# Patient Record
Sex: Male | Born: 1995 | Race: Black or African American | Hispanic: No | Marital: Single | Smoking: Never smoker
Health system: Southern US, Community
[De-identification: ages and names within clinical notes are randomized; demographics above are authoritative.]

## PROBLEM LIST (undated history)

## (undated) DIAGNOSIS — K649 Unspecified hemorrhoids: Secondary | ICD-10-CM

---

## 2016-11-04 ENCOUNTER — Emergency Department (HOSPITAL_COMMUNITY)
Admission: EM | Admit: 2016-11-04 | Discharge: 2016-11-04 | Disposition: A | Discharge status: Home / Self Care | Payer: Medicaid Other | Attending: Emergency Medicine | Admitting: Emergency Medicine

## 2016-11-04 ENCOUNTER — Encounter (HOSPITAL_COMMUNITY): Payer: Self-pay | Admitting: Family Medicine

## 2016-11-04 DIAGNOSIS — K644 Residual hemorrhoidal skin tags: Secondary | ICD-10-CM | POA: Diagnosis not present

## 2016-11-04 DIAGNOSIS — K649 Unspecified hemorrhoids: Secondary | ICD-10-CM | POA: Diagnosis present

## 2016-11-04 HISTORY — DX: Unspecified hemorrhoids: K64.9

## 2016-11-04 MED ORDER — HYDROCODONE-ACETAMINOPHEN 5-325 MG PO TABS
1.0000 | ORAL_TABLET | Freq: Once | ORAL | Status: AC
  Administered 2016-11-04: 1 via ORAL
  Filled 2016-11-04: qty 1

## 2016-11-04 MED ORDER — HYDROCORTISONE 2.5 % RE CREA: TOPICAL_CREAM | RECTAL | 0 refills | Status: DC

## 2016-11-04 MED ORDER — LIDOCAINE-EPINEPHRINE (PF) 2 %-1:200000 IJ SOLN
10.0000 mL | Freq: Once | INTRAMUSCULAR | Status: DC
  Filled 2016-11-04: qty 20

## 2016-11-04 MED ORDER — DOCUSATE SODIUM 100 MG PO CAPS: 100.0000 mg | ORAL_CAPSULE | Freq: Two times a day (BID) | ORAL | 0 refills | Status: DC

## 2016-11-04 NOTE — ED Triage Notes (Addendum)
Patient reports he has external hemorrhoids that started 3 days ago. Tried Preparation H suppository and Ibuprofen for pain.

## 2016-11-04 NOTE — Discharge Instructions (Signed)
Please follow up with general surgery Try Sitz baths (warm baths that you sit in) Take stool softener and use rectal cream

## 2016-11-04 NOTE — ED Notes (Signed)
Pt has a ride home.  

## 2016-11-04 NOTE — ED Provider Notes (Signed)
[facility]-EMERGENCY DEPT Provider Note   CSN: [id number] Arrival date & time: 11/04/16  1607 By signing my name below, I, Bridgette HabermannMaria Tan, attest that this documentation has been prepared under the direction and in the presence of Terance HartKelly Venicia Vandall, PA-C. Electronically Signed: Bridgette HabermannMaria Tan, ED Scribe. 11/04/16. 5:23 PM.  History   Chief Complaint Chief Complaint  Patient presents with  . Hemorrhoids   HPI Comments: Tony Hooper is a 20 y.o. male with h/o hemorrhoids who presents to the Emergency Department complaining of 8/10 rectal pain onset 3 days ago. Pain is exacerbated with bowel movements and sitting. It gets bigger and smaller throughout the day. Pt has tried Preparation H suppository and Ibuprofen with no relief. Pt states he has h/o hemorrhoids and notes his symptoms at this time feel similar. He has had them lanced ~ [place] and also had them surgically removed. Reports associated small amount of hematochezia. Pt denies fever, abdominal pain, N/V.  The history is provided by the patient. No language interpreter was used.    Past Medical History:  Diagnosis Date  . Hemorrhoids     There are no active problems to display for this patient.   History reviewed. No pertinent surgical history.   Home Medications    Prior to Admission medications   Not on File    Family History History reviewed. No pertinent family history.  Social History Social History  Substance Use Topics  . Smoking status: Never Smoker  . Smokeless tobacco: Never Used  . Alcohol use No     Allergies   Patient has no allergy information on record.   Review of Systems Review of Systems  Constitutional: Negative for fever.  Gastrointestinal: Positive for rectal pain.     Physical Exam Updated Vital Signs BP 140/74 (BP Location: Right Arm)   Pulse 70   Temp 98.2 F (36.8 C) (Oral)   Resp 20   Ht 6\' 2"  (1.88 m)   Wt 175 lb (79.4 kg)   SpO2 100%   BMI 22.47 kg/m   Physical Exam    Constitutional: He appears well-developed and well-nourished.  HENT:  Head: Normocephalic.  Eyes: Conjunctivae are normal.  Cardiovascular: Normal rate.   Pulmonary/Chest: Effort normal. No respiratory distress.  Abdominal: He exhibits no distension.  Genitourinary:  Genitourinary Comments: Rectal: Small, thrombosed external hemorrhoid which is tender to palpation. No gross blood, fissures, redness, area of fluctuance, lesions, or tenderness. Chaperone present during exam.  Musculoskeletal: Normal range of motion.  Neurological: He is alert.  Skin: Skin is warm and dry.  Psychiatric: He has a normal mood and affect. His behavior is normal.  Nursing note and vitals reviewed.    ED Treatments / Results  DIAGNOSTIC STUDIES: Oxygen Saturation is 100% on RA, normal by my interpretation.    COORDINATION OF CARE: 5:23 PM Discussed treatment plan with pt at bedside and pt agreed to plan.  Labs (all labs ordered are listed, but only abnormal results are displayed) Labs Reviewed - No data to display  EKG  EKG Interpretation None       Radiology No results found.  Procedures Procedures (including critical care time)  Medications Ordered in ED Medications  lidocaine-EPINEPHrine (XYLOCAINE W/EPI) 2 %-1:200000 (PF) injection 10 mL (not administered)  HYDROcodone-acetaminophen (NORCO/VICODIN) 5-325 MG per tablet 1 tablet (1 tablet Oral Given 11/04/16 1754)     Initial Impression / Assessment and Plan / ED Course  I have reviewed the triage vital signs and the nursing  notes.  Pertinent labs & imaging results that were available during my care of the patient were reviewed by me and considered in my medical decision making (see chart for details).  Clinical Course    20 year old male with thrombosed hemorrhoid. He was seen an evaluated with Dr. Criss AlvineGoldston. Due to small size, will defer lancing. Rx given for Anusol and Colace. Recommend sitz baths and follow up with general  surgery. Patient is NAD, non-toxic, with stable VS. Patient is informed of clinical course, understands medical decision making process, and agrees with plan. Opportunity for questions provided and all questions answered. Return precautions given.   Final Clinical Impressions(s) / ED Diagnoses   Final diagnoses:  External hemorrhoids    New Prescriptions New Prescriptions   No medications on file   I personally performed the services described in this documentation, which was scribed in my presence. The recorded information has been reviewed and is accurate.     Bethel BornKelly Marie Kamira Mellette, PA-C 11/04/16 2109    Pricilla LovelessScott Goldston, MD 11/11/16 0100

## 2016-12-29 ENCOUNTER — Encounter (HOSPITAL_COMMUNITY): Payer: Self-pay

## 2016-12-29 ENCOUNTER — Emergency Department (HOSPITAL_COMMUNITY)
Admission: EM | Admit: 2016-12-29 | Discharge: 2016-12-30 | Disposition: A | Discharge status: Home / Self Care | Payer: Medicaid Other | Attending: Emergency Medicine | Admitting: Emergency Medicine

## 2016-12-29 ENCOUNTER — Emergency Department (HOSPITAL_COMMUNITY): Payer: Medicaid Other

## 2016-12-29 DIAGNOSIS — N50811 Right testicular pain: Secondary | ICD-10-CM | POA: Insufficient documentation

## 2016-12-29 DIAGNOSIS — Z79899 Other long term (current) drug therapy: Secondary | ICD-10-CM | POA: Diagnosis not present

## 2016-12-29 MED ORDER — IBUPROFEN 200 MG PO TABS
400.0000 mg | ORAL_TABLET | Freq: Once | ORAL | Status: AC
  Administered 2016-12-30: 400 mg via ORAL
  Filled 2016-12-29: qty 2

## 2016-12-29 NOTE — ED Triage Notes (Signed)
Pt states that he noticed a lump on his right testicle about 3 months ago. States that it has started bothering him more and more prompting him to come in tonight. Reports 8/10 pain. Denies N/V/F. A&Ox4. Ambulatory.

## 2016-12-29 NOTE — ED Provider Notes (Signed)
MSE was initiated and I personally evaluated the patient and placed orders (if any) at  11:01 PM on December 29, 2016.  The patient appears stable so that the remainder of the MSE may be completed by another provider.  Blood pressure 164/79, pulse 91, temperature 98.3 F (36.8 C), temperature source Oral, resp. rate 16, SpO2 100 %.  Tony Hooper is a 21 y.o. male complaining of right testicular pain which she's had for several months. He states that the pain has increased recently to 7 out of 10. Denies dysuria, urethral discharge, urinary frequency. He states that he feels a lump in the right testicle.  Physical exam with no significant testicular swelling, tenderness, no overlying skin changes.  Patient put in for urinalysis, ultrasound ibuprofen for comfort.   Wynetta Emeryicole Toriana Sponsel, PA-C 12/29/16 2302    Dione Boozeavid Glick, MD 12/30/16 [phone]

## 2016-12-30 LAB — URINALYSIS, ROUTINE W REFLEX MICROSCOPIC
BILIRUBIN URINE: NEGATIVE
Glucose, UA: NEGATIVE mg/dL
Hgb urine dipstick: NEGATIVE
KETONES UR: NEGATIVE mg/dL
Leukocytes, UA: NEGATIVE
NITRITE: NEGATIVE
Protein, ur: NEGATIVE mg/dL
Specific Gravity, Urine: 1.017 (ref 1.005–1.030)
pH: 7 (ref 5.0–8.0)

## 2016-12-30 NOTE — ED Provider Notes (Signed)
[facility]-EMERGENCY DEPT Provider Note   CSN: [id number] Arrival date & time: 12/29/16  2055     History   Chief Complaint Chief Complaint  Patient presents with  . Testicle Pain    HPI   Blood pressure 158/86, pulse 71, temperature 98.3 F (36.8 C), temperature source Oral, resp. rate 15, SpO2 95 %.  Tony Hooper is a 21 y.o. male complaining of right testicular pain and swelling which she noticed 3 months ago, worsened today to about 7 out of 10. No pain medication taken prior to arrival. He states that the right scrotum is swollen. It hasn't significantly worsened recently. He denies dysuria, hematuria, urethral discharge or urinary frequency. No other rash or lesion.  Past Medical History:  Diagnosis Date  . Hemorrhoids     There are no active problems to display for this patient.   History reviewed. No pertinent surgical history.     Home Medications    Prior to Admission medications   Medication Sig Start Date End Date Taking? Authorizing Provider  docusate sodium (COLACE) 100 MG capsule Take 1 capsule (100 mg total) by mouth every 12 (twelve) hours. 11/04/16   Bethel Born, PA-C  hydrocortisone (ANUSOL-HC) 2.5 % rectal cream Apply rectally 2 times daily 11/04/16   Bethel Born, PA-C    Family History History reviewed. No pertinent family history.  Social History Social History  Substance Use Topics  . Smoking status: Never Smoker  . Smokeless tobacco: Never Used  . Alcohol use No     Allergies   Patient has no known allergies.   Review of Systems Review of Systems  10 systems reviewed and found to be negative, except as noted in the HPI.   Physical Exam Updated Vital Signs BP 164/79 (BP Location: Right Arm)   Pulse 91   Temp 98.3 F (36.8 C) (Oral)   Resp 16   SpO2 100%   Physical Exam  Constitutional: He is oriented to person, place, and time. He appears well-developed and well-nourished. No distress.  HENT:  Head:  Normocephalic and atraumatic.  Mouth/Throat: Oropharynx is clear and moist.  Eyes: Conjunctivae and EOM are normal. Pupils are equal, round, and reactive to light.  Neck: Normal range of motion.  Cardiovascular: Normal rate, regular rhythm and intact distal pulses.   Pulmonary/Chest: Effort normal and breath sounds normal.  Abdominal: Soft. There is no tenderness.  Genitourinary:  Genitourinary Comments: GU exam a chaperoned by nurse: No rashes or lesions, no significant testicular swelling, no overlying skin changes, no tenderness to palpation.  Musculoskeletal: Normal range of motion.  Neurological: He is alert and oriented to person, place, and time.  Skin: He is not diaphoretic.  Psychiatric: He has a normal mood and affect.  Nursing note and vitals reviewed.    ED Treatments / Results  Labs (all labs ordered are listed, but only abnormal results are displayed) Labs Reviewed  URINALYSIS, ROUTINE W REFLEX MICROSCOPIC  GC/CHLAMYDIA PROBE AMP ([place]) NOT AT [facility]    EKG  EKG Interpretation None       Radiology No results found.  Procedures Procedures (including critical care time)  Medications Ordered in ED Medications  ibuprofen (ADVIL,MOTRIN) tablet 400 mg (not administered)     Initial Impression / Assessment and Plan / ED Course  I have reviewed the triage vital signs and the nursing notes.  Pertinent labs & imaging results that were available during my care of the patient were reviewed by me and considered in  my medical decision making (see chart for details).     Vitals:   12/29/16 2132 12/30/16 0024  BP: 164/79 158/86  Pulse: 91 71  Resp: 16 15  Temp: 98.3 F (36.8 C)   TempSrc: Oral   SpO2: 100% 95%    Medications  ibuprofen (ADVIL,MOTRIN) tablet 400 mg (400 mg Oral Given 12/30/16 0022)    Tony Hooper is 21 y.o. male presenting with Several months of right testicular pain and swelling. On my exam I don't find any abnormalities,  ultrasound of scrotum also negative. Ultrasound tech dates that were patient is calling a masses actually his epididymis. Urinalysis without signs of infection. Reassured patient and his mother.  Evaluation does not show pathology that would require ongoing emergent intervention or inpatient treatment. Pt is hemodynamically stable and mentating appropriately. Discussed findings and plan with patient/guardian, who agrees with care plan. All questions answered. Return precautions discussed and outpatient follow up given.      Final Clinical Impressions(s) / ED Diagnoses   Final diagnoses:  None    New Prescriptions New Prescriptions   No medications on file     Wynetta Emeryicole Olman Yono, PA-C 12/30/16 0048    Dione Boozeavid Glick, MD 12/30/16 [phone]

## 2016-12-30 NOTE — ED Notes (Signed)
Patient d/c'd self care.  F/U reviewed.  Patient verbalized understanding. 

## 2016-12-30 NOTE — Discharge Instructions (Signed)
For pain control please take Ibuprofen (also known as Motrin or Advil) 400mg (this is normally 2 over the counter pills) every 6 hours. Take with food to minimize stomach irritation. ° °Do not hesitate to return to the emergency room for any new, worsening or concerning symptoms. ° °Please obtain primary care using resource guide below. Let them know that you were seen in the emergency room and that they will need to obtain records for further outpatient management. ° ° °

## 2017-01-01 LAB — GC/CHLAMYDIA PROBE AMP (~~LOC~~) NOT AT ARMC
Chlamydia: NEGATIVE
Neisseria Gonorrhea: NEGATIVE

## 2018-03-02 IMAGING — US US SCROTUM
1 series · 14 of 25 positions shown · non-contrast
Comparison: None.

CLINICAL DATA: 20 y/o  M; lump of the right testicle for 3 months.

EXAM:
ULTRASOUND OF SCROTUM
TECHNIQUE: Complete ultrasound examination of the testicles, epididymis, and
other scrotal structures was performed.

[Series 1: us scrotum · 0.07mm/px · 14 of 43 slices shown]
[im 1/43]
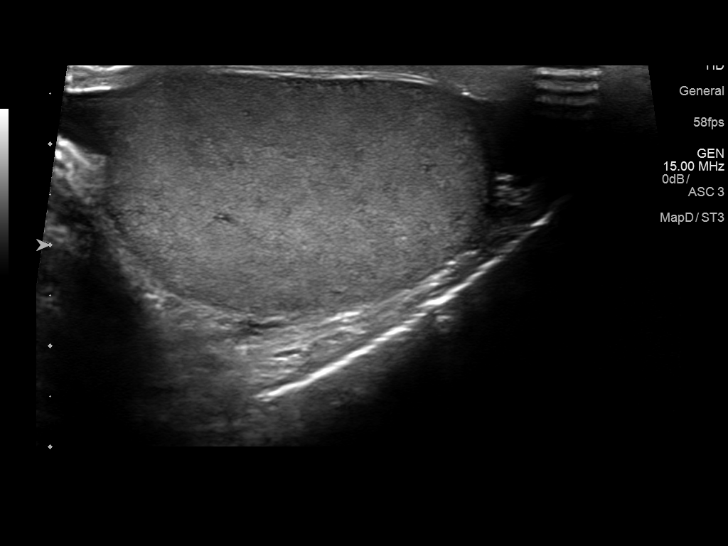
[im 4/43]
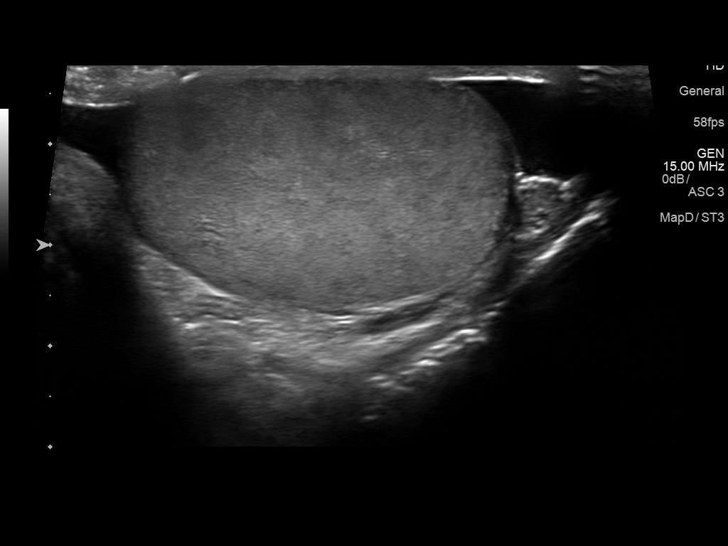
[im 8/43]
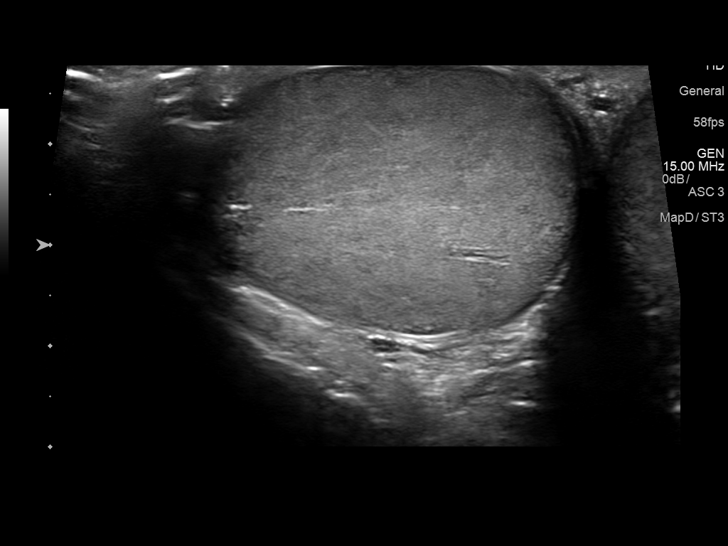
[im 11/43]
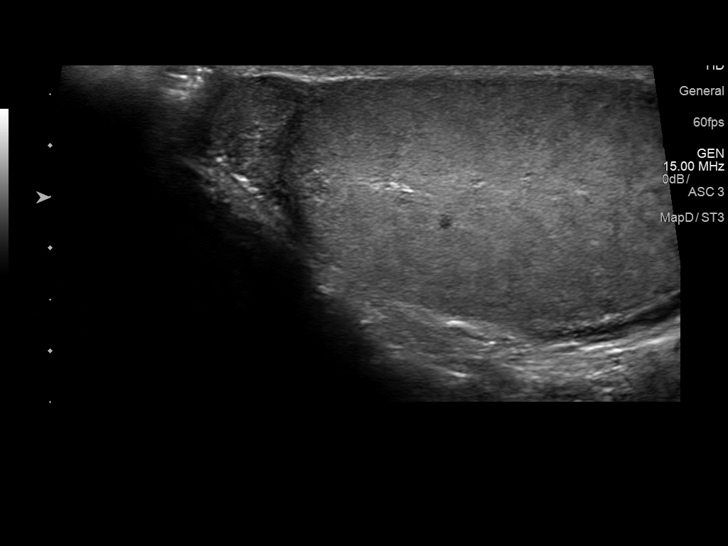
[im 15/43]
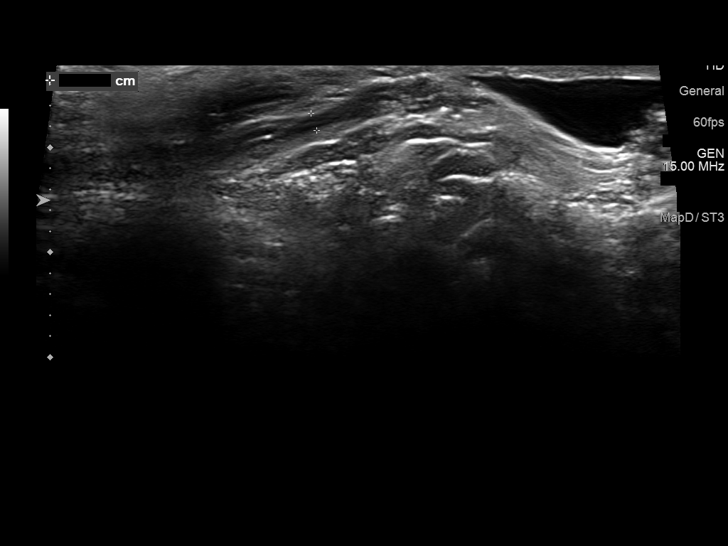
[im 16/43]
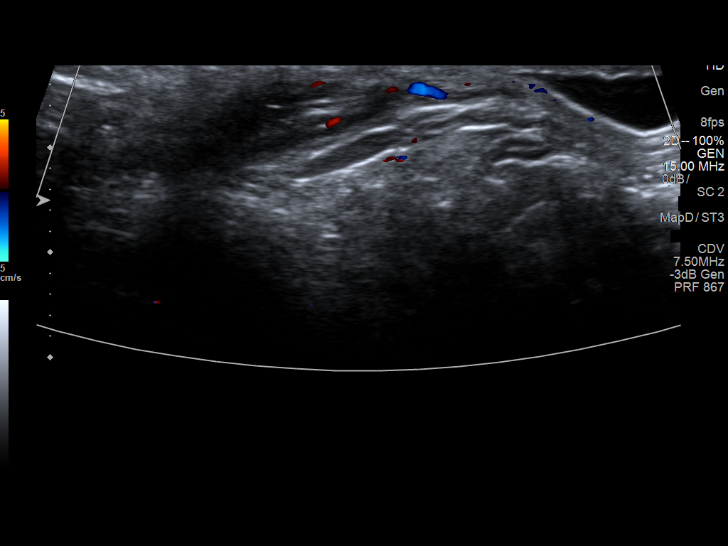
[im 20/43]
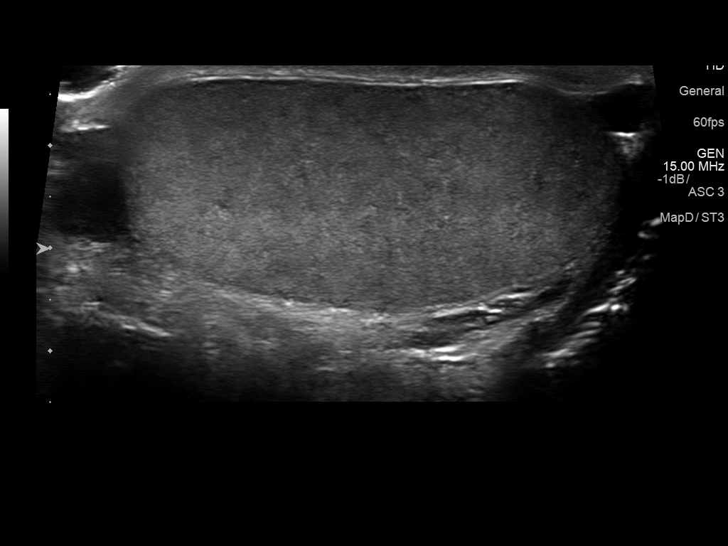
[im 23/43]
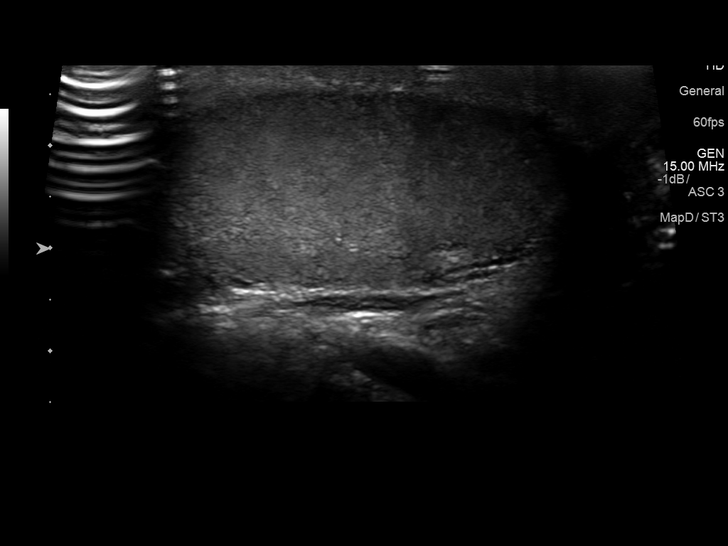
[im 27/43]
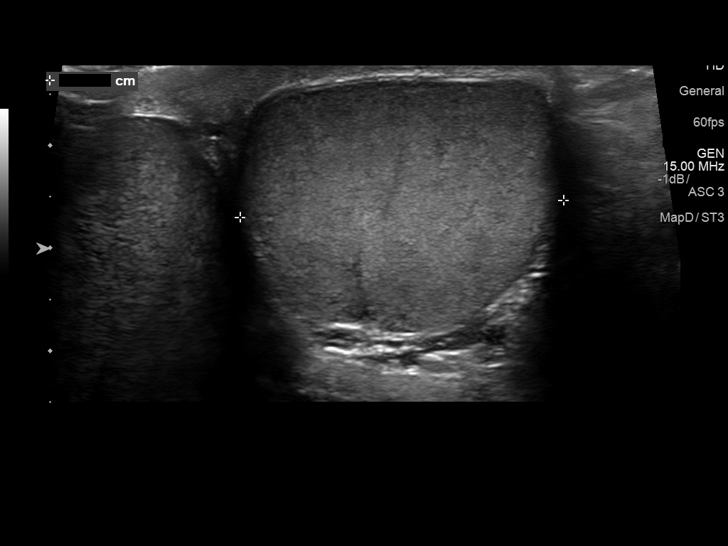
[im 29/43]
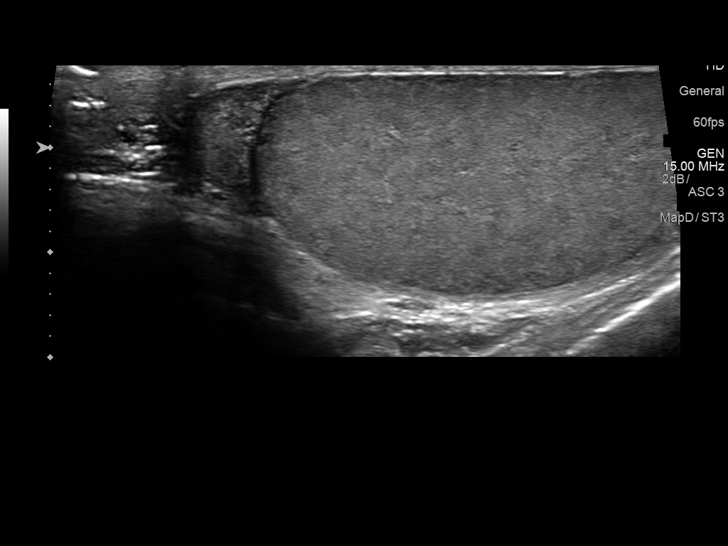
[im 32/43]
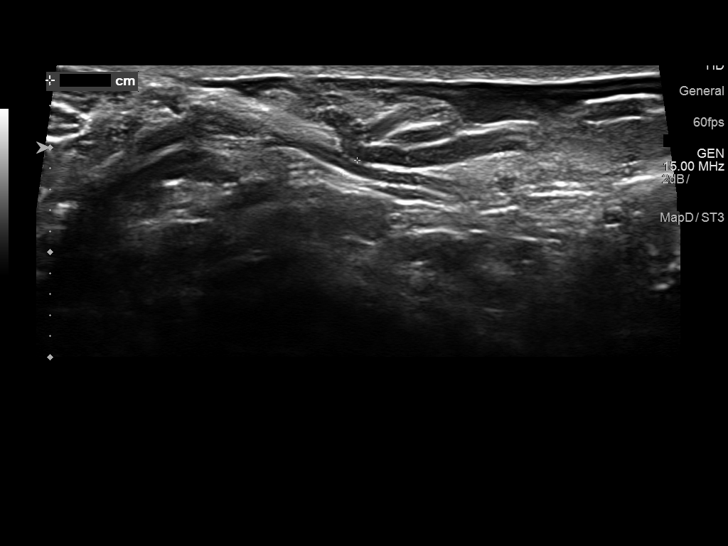
[im 36/43]
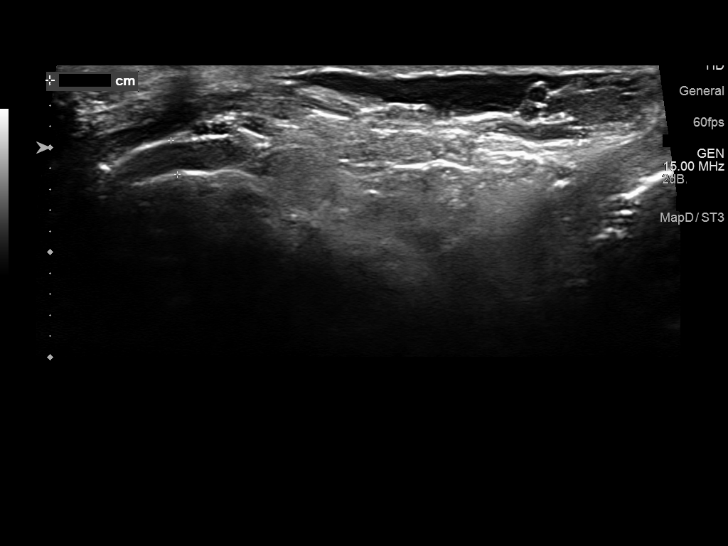
[im 39/43]
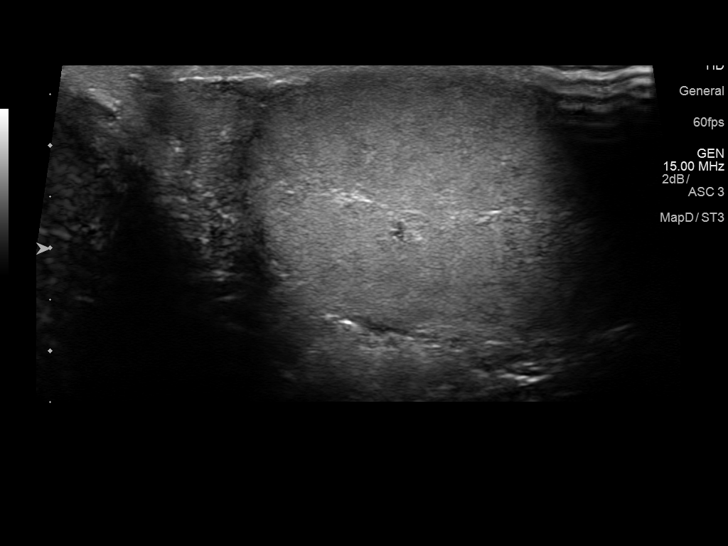
[im 43/43]
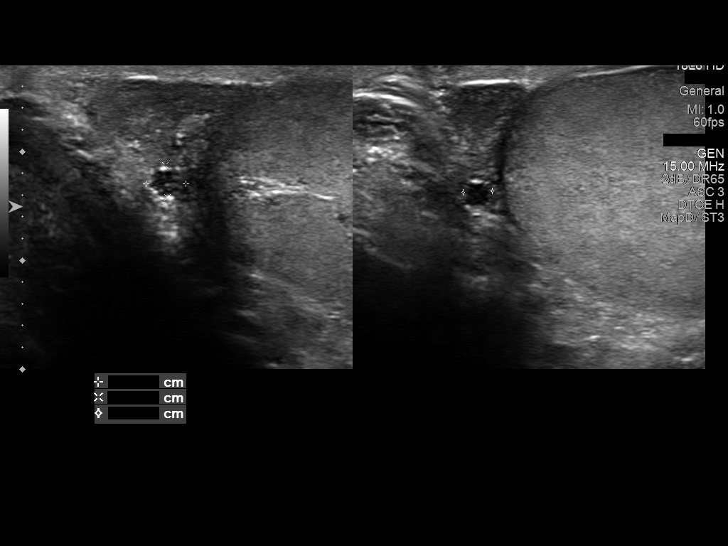

[14 of 25 positions shown; findings below may reference images not displayed]

FINDINGS: Right testicle

Measurements: 4.0 x 2.3 x 3.6 cm. No mass or microlithiasis
visualized.

Left testicle

Measurements: 4.9 x 2.2 x 3.2 cm. No mass or microlithiasis
visualized.

Right epididymis: Normal in size and appearance. 4 mm simple
appearing cyst.

Left epididymis:  Normal in size and appearance.

Hydrocele:  None visualized.

Varicocele:  None visualized.
IMPRESSION: Negative. No evidence for testicular mass or other significant
abnormality. Area of palpable interest corresponds to the
epididymis.

By: Ferienhaus Erxleben M.D.

## 2019-08-22 ENCOUNTER — Emergency Department (HOSPITAL_COMMUNITY)
Admission: EM | Admit: 2019-08-22 | Discharge: 2019-08-22 | Disposition: A | Discharge status: Home / Self Care | Payer: Medicaid - Out of State

## 2019-08-22 ENCOUNTER — Other Ambulatory Visit: Payer: Self-pay

## 2019-08-22 ENCOUNTER — Emergency Department (HOSPITAL_COMMUNITY)
Admission: EM | Admit: 2019-08-22 | Discharge: 2019-08-22 | Disposition: A | Discharge status: Home / Self Care | Payer: Medicaid - Out of State | Attending: Emergency Medicine | Admitting: Emergency Medicine

## 2019-08-22 DIAGNOSIS — R51 Headache: Secondary | ICD-10-CM | POA: Insufficient documentation

## 2019-08-22 DIAGNOSIS — R112 Nausea with vomiting, unspecified: Secondary | ICD-10-CM

## 2019-08-22 DIAGNOSIS — Z20828 Contact with and (suspected) exposure to other viral communicable diseases: Secondary | ICD-10-CM | POA: Diagnosis not present

## 2019-08-22 DIAGNOSIS — Z20822 Contact with and (suspected) exposure to covid-19: Secondary | ICD-10-CM

## 2019-08-22 LAB — SARS CORONAVIRUS 2 (TAT 6-24 HRS): SARS Coronavirus 2: NEGATIVE

## 2019-08-22 MED ORDER — ONDANSETRON HCL 4 MG PO TABS: 4.0000 mg | ORAL_TABLET | Freq: Three times a day (TID) | ORAL | 0 refills | Status: AC | PRN

## 2019-08-22 NOTE — ED Notes (Signed)
An After Visit Summary was printed and given to the patient. Discharge instructions given and no further questions at this time.  

## 2019-08-22 NOTE — Discharge Instructions (Addendum)
You have been tested for COVID-19. IT will take a few days before the test will result.  You will be notify if you test positive.  Follow instruction below.

## 2019-08-22 NOTE — ED Triage Notes (Addendum)
Pt c/o headache, emesis x2 days. Pt states he was exposed to [place] with COVID+ uncle.

## 2019-08-22 NOTE — ED Notes (Signed)
Called pt x3 for triage. No response.

## 2019-08-22 NOTE — ED Provider Notes (Signed)
[name] Provider Note   CSN: [id number] Arrival date & time: 08/22/19  1241     History   Chief Complaint Chief Complaint  Patient presents with  . Headache  . Emesis    HPI Tony Hooper is a 23 y.o. male.     The history is provided by the patient. No language interpreter was used.  Headache Associated symptoms: vomiting   Emesis Associated symptoms: headaches      23 year old male presenting with complaints of headache.  Patient report he was exposed to his uncle who recently test positive for COVID-19.  Patient states 3 days ago he was hanging out with his uncle at that time concerned was complaining of a headache in which he had improved after he took some over-the-counter medication.  He did went to have his covid test and he was reported to test positive for COVID-19.  Patient states for the past 2 days he also endorsed a mild throbbing headache, felt nauseous, vomited once, and has had several bouts of loose stools.  Endorsed mild abdominal cramping.  He is concerned for potential COVID-19 given recent exposure.  He does not complain of any chest pain or shortness of breath denies runny nose sneezing or coughing loss of taste or smell.  No recent travel.  No specific treatment tried.  He denies any light or sound sensitivity or neck stiffness.  Past Medical History:  Diagnosis Date  . Hemorrhoids     There are no active problems to display for this patient.   No past surgical history on file.      Home Medications    Prior to Admission medications   Medication Sig Start Date End Date Taking? Authorizing Provider  docusate sodium (COLACE) 100 MG capsule Take 1 capsule (100 mg total) by mouth every 12 (twelve) hours. 11/04/16   Recardo Evangelist, PA-C  hydrocortisone (ANUSOL-HC) 2.5 % rectal cream Apply rectally 2 times daily 11/04/16   Recardo Evangelist, PA-C    Family History No family history on file.  Social  History Social History   Tobacco Use  . Smoking status: Never Smoker  . Smokeless tobacco: Never Used  Substance Use Topics  . Alcohol use: No  . Drug use: No     Allergies   Patient has no known allergies.   Review of Systems Review of Systems  Gastrointestinal: Positive for vomiting.  Neurological: Positive for headaches.  All other systems reviewed and are negative.    Physical Exam Updated Vital Signs BP 132/77   Pulse 88   Temp 97.9 F (36.6 C) (Oral)   Resp 18   SpO2 99%   Physical Exam Vitals signs and nursing note reviewed.  Constitutional:      General: He is not in acute distress.    Appearance: He is well-developed.     Comments: Patient is well-appearing in no acute discomfort.  HENT:     Head: Atraumatic.  Eyes:     Conjunctiva/sclera: Conjunctivae normal.  Neck:     Musculoskeletal: Normal range of motion and neck supple. No neck rigidity.  Cardiovascular:     Rate and Rhythm: Normal rate and regular rhythm.  Pulmonary:     Effort: Pulmonary effort is normal.     Breath sounds: Normal breath sounds. No wheezing.  Abdominal:     General: Bowel sounds are normal.     Palpations: Abdomen is soft.     Tenderness: There is no abdominal tenderness.  Lymphadenopathy:     Cervical: No cervical adenopathy.  Skin:    Findings: No rash.  Neurological:     Mental Status: He is alert.     GCS: GCS eye subscore is 4. GCS verbal subscore is 5. GCS motor subscore is 6.  Psychiatric:        Mood and Affect: Mood normal.      ED Treatments / Results  Labs (all labs ordered are listed, but only abnormal results are displayed) Labs Reviewed  SARS CORONAVIRUS 2 (TAT 6-24 HRS)    EKG None  Radiology No results found.  Procedures Procedures (including critical care time)  Medications Ordered in ED Medications - No data to display   Initial Impression / Assessment and Plan / ED Course  I have reviewed the triage vital signs and the nursing  notes.  Pertinent labs & imaging results that were available during my care of the patient were reviewed by me and considered in my medical decision making (see chart for details).        BP 132/77   Pulse 88   Temp 97.9 F (36.6 C) (Oral)   Resp 18   SpO2 99%    Final Clinical Impressions(s) / ED Diagnoses   Final diagnoses:  Suspected Covid-19 Virus Infection  Nausea vomiting and diarrhea    ED Discharge Orders         Ordered    ondansetron (ZOFRAN) 4 MG tablet  Every 8 hours PRN     08/22/19 1418         2:16 PM Patient report recently exposed to his uncle who test positive for COVID-19 a few days ago.  Patient reports concerning for potential exposure.  He does not have any right upper respiratory problem no chest pain or shortness of breath.  He does complain of some nausea vomiting diarrhea.  Abdomen is soft nontender on exam.  Will obtain COVID-19 test.  Instruction to self quarantine was given.  Patient will be notified if test positive.  [name] was evaluated in Emergency Department on 08/22/2019 for the symptoms described in the history of present illness. He was evaluated in the context of the global COVID-19 pandemic, which necessitated consideration that the patient might be at risk for infection with the SARS-CoV-2 virus that causes COVID-19. Institutional protocols and algorithms that pertain to the evaluation of patients at risk for COVID-19 are in a state of rapid change based on information released by regulatory bodies including the CDC and federal and state organizations. These policies and algorithms were followed during the patient's care in the ED.    Fayrene Helperran, Linnell Swords, PA-C 08/22/19 1420    Little, Ambrose Finlandachel Morgan, MD 08/23/19 1149

## 2019-08-25 ENCOUNTER — Telehealth: Payer: Self-pay | Admitting: General Practice

## 2019-08-25 NOTE — Telephone Encounter (Signed)
Pt aware covid lab test negative, not detected °

## 2022-09-11 ENCOUNTER — Emergency Department (HOSPITAL_COMMUNITY)
Admission: EM | Admit: 2022-09-11 | Discharge: 2022-09-11 | Disposition: A | Discharge status: Home / Self Care | Payer: Medicaid Other | Attending: Emergency Medicine | Admitting: Emergency Medicine

## 2022-09-11 ENCOUNTER — Encounter (HOSPITAL_COMMUNITY): Payer: Self-pay

## 2022-09-11 DIAGNOSIS — K649 Unspecified hemorrhoids: Secondary | ICD-10-CM | POA: Insufficient documentation

## 2022-09-11 MED ORDER — DOCUSATE SODIUM 100 MG PO CAPS: 100.0000 mg | ORAL_CAPSULE | Freq: Two times a day (BID) | ORAL | 0 refills | Status: AC

## 2022-09-11 MED ORDER — HYDROCORTISONE (PERIANAL) 2.5 % EX CREA: 1.0000 | TOPICAL_CREAM | Freq: Two times a day (BID) | CUTANEOUS | 0 refills | Status: AC

## 2022-09-11 NOTE — Discharge Instructions (Addendum)
Use the Anusol cream twice daily and take docusate every 12 hours to help with stool softening.  Do sitz bath 3 times daily, a lot of the times these will resolve on their own.  If it does not resolve you will need to follow-up with general surgery, information is above.  Return if you are unable to defecate, fevers, new concerning symptoms.

## 2022-09-11 NOTE — ED Triage Notes (Signed)
Pt arrived via POV, c/o hemorrhoids. States hx of same.

## 2022-09-11 NOTE — ED Provider Notes (Signed)
[facility] DEPT Provider Note   CSN: [id number] Arrival date & time: 09/11/22  1252     History  No chief complaint on file.   Tony Hooper is a 26 y.o. male.  HPI   Patient presents due to hemorrhoids.  Started about 3 days ago, history of the same.  He has had previous right ectomy, not currently followed by general surgery.  He denies any abdominal pain, nausea, vomiting, diarrhea.  He is able to have bowel movements although that it makes the pain worse, denies any fevers or chills.  Did not receive receptive sex.  Home Medications Prior to Admission medications   Medication Sig Start Date End Date Taking? Authorizing Provider  hydrocortisone (ANUSOL-HC) 2.5 % rectal cream Place 1 Application rectally 2 (two) times daily. 09/11/22  Yes Sherrill Raring, PA-C  docusate sodium (COLACE) 100 MG capsule Take 1 capsule (100 mg total) by mouth every 12 (twelve) hours. 09/11/22   Sherrill Raring, PA-C  ondansetron (ZOFRAN) 4 MG tablet Take 1 tablet (4 mg total) by mouth every 8 (eight) hours as needed for nausea or vomiting. 08/22/19   Domenic Moras, PA-C      Allergies    Patient has no known allergies.    Review of Systems   Review of Systems  Physical Exam Updated Vital Signs BP 135/80 (BP Location: Left Arm)   Pulse 92   Temp 98.1 F (36.7 C) (Oral)   Resp 16   SpO2 96%  Physical Exam Vitals and nursing note reviewed. Exam conducted with a chaperone present.  Constitutional:      Appearance: Normal appearance.  HENT:     Head: Normocephalic and atraumatic.  Eyes:     General: No scleral icterus.       Right eye: No discharge.        Left eye: No discharge.     Extraocular Movements: Extraocular movements intact.     Pupils: Pupils are equal, round, and reactive to light.  Cardiovascular:     Rate and Rhythm: Normal rate and regular rhythm.     Pulses: Normal pulses.     Heart sounds: Normal heart sounds. No murmur heard.    No friction rub.  No gallop.  Pulmonary:     Effort: Pulmonary effort is normal. No respiratory distress.     Breath sounds: Normal breath sounds.  Abdominal:     General: Abdomen is flat. Bowel sounds are normal. There is no distension.     Palpations: Abdomen is soft.     Tenderness: There is no abdominal tenderness.  Genitourinary:    Comments: Rectal exam performed with male chaperone in room.  Patient has external hemorrhoid as well as a very small thrombosed hemorrhoid roughly 7 PM.  No active bleeding, no induration or erythema.  No anal fissure appreciated. Skin:    General: Skin is warm and dry.     Coloration: Skin is not jaundiced.  Neurological:     Mental Status: He is alert. Mental status is at baseline.     Coordination: Coordination normal.     ED Results / Procedures / Treatments   Labs (all labs ordered are listed, but only abnormal results are displayed) Labs Reviewed - No data to display  EKG None  Radiology No results found.  Procedures Procedures    Medications Ordered in ED Medications - No data to display  ED Course/ Medical Decision Making/ A&P  Medical Decision Making Risk OTC drugs. Prescription drug management.   Patient presents due to hemorrhoid pain x72 hours.  Physical exam seems consistent with a small thrombosed hemorrhoid.  No anal fissure, obstructing mass, actively bleeding hemorrhoid, abscess.  Will have patient follow-up with general surgery as needed, advised sitz bath, Anusol and docusate.  Strict return precautions were discussed, patient discharged stable condition.        Final Clinical Impression(s) / ED Diagnoses Final diagnoses:  Hemorrhoids, unspecified hemorrhoid type    Rx / DC Orders ED Discharge Orders          Ordered    hydrocortisone (ANUSOL-HC) 2.5 % rectal cream  2 times daily        09/11/22 1333    docusate sodium (COLACE) 100 MG capsule  Every 12 hours        09/11/22 1333               Theron Arista, PA-C 09/11/22 2112    Rolan Bucco, MD 09/11/22 2119

## 2022-10-19 ENCOUNTER — Emergency Department (HOSPITAL_COMMUNITY): Payer: Medicaid Other

## 2022-10-19 ENCOUNTER — Encounter (HOSPITAL_COMMUNITY): Payer: Self-pay | Admitting: Emergency Medicine

## 2022-10-19 ENCOUNTER — Emergency Department (HOSPITAL_COMMUNITY)
Admission: EM | Admit: 2022-10-19 | Discharge: 2022-10-20 | Disposition: A | Discharge status: Home / Self Care | Payer: Medicaid Other | Attending: Student | Admitting: Student

## 2022-10-19 ENCOUNTER — Other Ambulatory Visit: Payer: Self-pay

## 2022-10-19 DIAGNOSIS — I861 Scrotal varices: Secondary | ICD-10-CM | POA: Insufficient documentation

## 2022-10-19 DIAGNOSIS — N50812 Left testicular pain: Secondary | ICD-10-CM | POA: Diagnosis present

## 2022-10-19 NOTE — ED Triage Notes (Signed)
Pt c/o abscess on testicle x 2 days.

## 2022-10-19 NOTE — ED Provider Triage Note (Signed)
Emergency Medicine Provider Triage Evaluation Note  Tony Hooper , a 26 y.o. male  was evaluated in triage.  Patient complains of a potential abscess to his testicles.  For the past 2 days he has had some tenderness and inflammation to his left scrotum.  Has a history of hernia.  Sexually active, no penile discharge, dysuria or hematuria.  No concern for STD.  Review of Systems  Positive:  Negative:   Physical Exam  BP (!) 145/86 (BP Location: Left Arm)   Pulse 89   Temp 97.6 F (36.4 C) (Oral)   Resp 16   Ht 6\' 2"  (1.88 m)   Wt 77.1 kg   SpO2 100%   BMI 21.83 kg/m  Gen:   Awake, no distress   Resp:  Normal effort  MSK:   Moves extremities without difficulty  Other:  Quarter sized area of induration just above the left scrotum.  No signs of torsion  Medical Decision Making  Medically screening exam initiated at 8:23 PM.  Appropriate orders placed.  Agustine Rossitto was informed that the remainder of the evaluation will be completed by another provider, this initial triage assessment does not replace that evaluation, and the importance of remaining in the ED until their evaluation is complete.     Horald Pollen, PA-C 10/19/22 2023

## 2022-10-20 LAB — URINALYSIS, ROUTINE W REFLEX MICROSCOPIC
Bacteria, UA: NONE SEEN
Bilirubin Urine: NEGATIVE
Glucose, UA: NEGATIVE mg/dL
Ketones, ur: NEGATIVE mg/dL
Leukocytes,Ua: NEGATIVE
Nitrite: NEGATIVE
Protein, ur: NEGATIVE mg/dL
Specific Gravity, Urine: 1.019 (ref 1.005–1.030)
pH: 6 (ref 5.0–8.0)

## 2022-10-20 MED ORDER — NAPROXEN 375 MG PO TABS: 375.0000 mg | ORAL_TABLET | Freq: Two times a day (BID) | ORAL | 0 refills | Status: AC

## 2022-10-20 NOTE — ED Provider Notes (Signed)
[facility]-EMERGENCY DEPT Provider Note  CSN: [id number] Arrival date & time: 10/19/22 2003  Chief Complaint(s) Abscess  HPI Tony Hooper is a 26 y.o. male who presents to the emergency department for evaluation of testicular pain.  Patient states for last 48 hours he has had a swelling of the left testicle and is concerned that he has an abscess or hernia.  Denies penile discharge, dysuria, increased frequency, fever, chest pain, shortness of breath or other systemic symptoms.   Past Medical History Past Medical History:  Diagnosis Date   Hemorrhoids    There are no problems to display for this patient.  Home Medication(s) Prior to Admission medications   Medication Sig Start Date End Date Taking? Authorizing Provider  naproxen (NAPROSYN) 375 MG tablet Take 1 tablet (375 mg total) by mouth 2 (two) times daily. 10/20/22  Yes Marne Meline, MD  docusate sodium (COLACE) 100 MG capsule Take 1 capsule (100 mg total) by mouth every 12 (twelve) hours. 09/11/22   Theron Arista, PA-C  hydrocortisone (ANUSOL-HC) 2.5 % rectal cream Place 1 Application rectally 2 (two) times daily. 09/11/22   Theron Arista, PA-C  ondansetron (ZOFRAN) 4 MG tablet Take 1 tablet (4 mg total) by mouth every 8 (eight) hours as needed for nausea or vomiting. 08/22/19   Fayrene Helper, PA-C                                                                                                                                    Past Surgical History History reviewed. No pertinent surgical history. Family History History reviewed. No pertinent family history.  Social History Social History   Tobacco Use   Smoking status: Never   Smokeless tobacco: Never  Substance Use Topics   Alcohol use: No   Drug use: No   Allergies Patient has no known allergies.  Review of Systems Review of Systems  Genitourinary:  Positive for testicular pain.    Physical Exam Vital Signs  I have reviewed the triage vital  signs BP (!) 150/83   Pulse 100   Temp 97.6 F (36.4 C) (Oral)   Resp 16   Ht 6\' 2"  (1.88 m)   Wt 77.1 kg   SpO2 100%   BMI 21.83 kg/m   Physical Exam Constitutional:      General: He is not in acute distress.    Appearance: Normal appearance.  HENT:     Head: Normocephalic and atraumatic.     Nose: No congestion or rhinorrhea.  Eyes:     General:        Right eye: No discharge.        Left eye: No discharge.     Extraocular Movements: Extraocular movements intact.     Pupils: Pupils are equal, round, and reactive to light.  Cardiovascular:     Rate and Rhythm: Normal rate and regular rhythm.     Heart sounds: No  murmur heard. Pulmonary:     Effort: No respiratory distress.     Breath sounds: No wheezing or rales.  Abdominal:     General: There is no distension.     Tenderness: There is no abdominal tenderness.  Genitourinary:    Comments: Left testicular tenderness Musculoskeletal:        General: Normal range of motion.     Cervical back: Normal range of motion.  Skin:    General: Skin is warm and dry.  Neurological:     General: No focal deficit present.     Mental Status: He is alert.     ED Results and Treatments Labs (all labs ordered are listed, but only abnormal results are displayed) Labs Reviewed  URINALYSIS, ROUTINE W REFLEX MICROSCOPIC - Abnormal; Notable for the following components:      Result Value   Hgb urine dipstick SMALL (*)    All other components within normal limits                                                                                                                          Radiology US SCROTUM W/DOPPLER  Result Date: 10/20/2022 CLINICAL DATA:  Left testicular pain EXAM: SCROTAL ULTRASOUND DOPPLER ULTRASOUND OF THE TESTICLES TECHNIQUE: Complete ultrasound examination of the testicles, epididymis, and other scrotal structures was performed. Color and spectral Doppler ultrasound were also utilized to evaluate blood flow to  the testicles. COMPARISON:  12/29/2016 FINDINGS: Right testicle Measurements: 5.0 x 2.5 x 3.3 cm normal color flow vascularity. Normal parenchymal echotexture and echogenicity. No mass or microlithiasis visualized. Left testicle Measurements: 5.2 x 2.2 x 3.3 cm. Normal color flow vascularity. Normal parenchymal echotexture and echogenicity. No mass or microlithiasis visualized. Right epididymis:  Normal in size and appearance. Left epididymis:  Normal in size and appearance. Hydrocele:  Small right hydrocele Varicocele: Small left varicocele is present, new since prior examination. Pulsed Doppler interrogation of both testes demonstrates normal low resistance arterial and venous waveforms bilaterally. IMPRESSION: 1. No evidence of testicular torsion. 2. Small left varicocele, new since prior examination. 3. Small right hydrocele. Electronically Signed   By: Helyn Numbers M.D.   On: 10/20/2022 00:10    Pertinent labs & imaging results that were available during my care of the patient were reviewed by me and considered in my medical decision making (see MDM for details).  Medications Ordered in ED Medications - No data to display  Procedures Procedures  (including critical care time)  Medical Decision Making / ED Course   This patient presents to the ED for concern of testicular pain, this involves an extensive number of treatment options, and is a complaint that carries with it a high risk of complications and morbidity.  The differential diagnosis includes varicocele, abscess, hydrocele, testicular torsion, testicular mass or epididymitis, UTI  MDM: Patient seen emergency room for evaluation of testicular pain.  Physical exam with tenderness at the left side of the scrotum and with elevation of left testicle.  Ultrasound with varicocele which is new from previous  examination.  Urinalysis unremarkable.  Patient provided education on varicocele, outpatient urologic follow-up and NSAID therapy.  Patient then discharged.   Additional history obtained: -Additional history obtained from significant other -External records from outside source obtained and reviewed including: Chart review including previous notes, labs, imaging, consultation notes   Lab Tests: -I ordered, reviewed, and interpreted labs.   The pertinent results include:   Labs Reviewed  URINALYSIS, ROUTINE W REFLEX MICROSCOPIC - Abnormal; Notable for the following components:      Result Value   Hgb urine dipstick SMALL (*)    All other components within normal limits        Imaging Studies ordered: I ordered imaging studies including testicular ultrasound I independently visualized and interpreted imaging. I agree with the radiologist interpretation   Medicines ordered and prescription drug management: Meds ordered this encounter  Medications   naproxen (NAPROSYN) 375 MG tablet    Sig: Take 1 tablet (375 mg total) by mouth 2 (two) times daily.    Dispense:  20 tablet    Refill:  0    -I have reviewed the patients home medicines and have made adjustments as needed  Critical interventions none   Cardiac Monitoring: The patient was maintained on a cardiac monitor.  I personally viewed and interpreted the cardiac monitored which showed an underlying rhythm of: nsr  Social Determinants of Health:  Factors impacting patients care include: Currently sexually active with 1 male partner   Reevaluation: After the interventions noted above, I reevaluated the patient and found that they have :stayed the same  Co morbidities that complicate the patient evaluation  Past Medical History:  Diagnosis Date   Hemorrhoids       Dispostion: I considered admission for this patient, but he does not meet inpatient criteria for admission and is safe for discharge with outpatient  follow-up     Final Clinical Impression(s) / ED Diagnoses Final diagnoses:  Varicocele     @PCDICTATION @    , MD 10/20/22 [phone]

## 2024-09-17 ENCOUNTER — Other Ambulatory Visit: Payer: Self-pay

## 2024-09-17 ENCOUNTER — Encounter (HOSPITAL_COMMUNITY): Payer: Self-pay | Admitting: Emergency Medicine

## 2024-09-17 ENCOUNTER — Emergency Department (HOSPITAL_COMMUNITY)
Admission: EM | Admit: 2024-09-17 | Discharge: 2024-09-17 | Disposition: A | Discharge status: Home / Self Care | Attending: Emergency Medicine | Admitting: Emergency Medicine

## 2024-09-17 ENCOUNTER — Emergency Department (HOSPITAL_COMMUNITY)

## 2024-09-17 DIAGNOSIS — Y9389 Activity, other specified: Secondary | ICD-10-CM | POA: Diagnosis not present

## 2024-09-17 DIAGNOSIS — S6992XA Unspecified injury of left wrist, hand and finger(s), initial encounter: Secondary | ICD-10-CM | POA: Diagnosis present

## 2024-09-17 DIAGNOSIS — X58XXXA Exposure to other specified factors, initial encounter: Secondary | ICD-10-CM | POA: Diagnosis not present

## 2024-09-17 DIAGNOSIS — M20022 Boutonniere deformity of left finger(s): Secondary | ICD-10-CM | POA: Diagnosis not present

## 2024-09-17 MED ORDER — OXYCODONE-ACETAMINOPHEN 5-325 MG PO TABS
1.0000 | ORAL_TABLET | Freq: Once | ORAL | Status: AC
  Administered 2024-09-17: 1 via ORAL
  Filled 2024-09-17: qty 1
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
## 2024-09-17 NOTE — Discharge Instructions (Signed)
 You were seen today for an injury to the left finger.  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  This is usually a product of injury to the pulley system within the finger.  Keep the joint splinted in extension.  You will likely require a specialized splint.  It is importantly follow-up with hand surgery for this.
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
## 2024-10-02 ENCOUNTER — Ambulatory Visit: Admitting: Orthopedic Surgery
